# Patient Record
Sex: Female | Born: 1945 | Race: White | Hispanic: No
Health system: Southern US, Community
[De-identification: ages and names within clinical notes are randomized; demographics above are authoritative.]

---

## 2005-08-13 ENCOUNTER — Ambulatory Visit: Payer: Self-pay | Admitting: Cardiology

## 2005-08-17 ENCOUNTER — Ambulatory Visit: Payer: Self-pay | Admitting: Cardiology

## 2005-08-21 ENCOUNTER — Ambulatory Visit: Payer: Self-pay | Admitting: Cardiology

## 2005-08-23 ENCOUNTER — Inpatient Hospital Stay (HOSPITAL_BASED_OUTPATIENT_CLINIC_OR_DEPARTMENT_OTHER): Admission: RE | Admit: 2005-08-23 | Discharge: 2005-08-23 | Payer: Self-pay | Admitting: Internal Medicine

## 2005-08-23 ENCOUNTER — Ambulatory Visit: Payer: Self-pay | Admitting: Internal Medicine

## 2007-10-23 ENCOUNTER — Inpatient Hospital Stay (HOSPITAL_COMMUNITY): Admission: RE | Admit: 2007-10-23 | Discharge: 2007-10-28 | Payer: Self-pay | Admitting: Orthopaedic Surgery

## 2010-08-15 NOTE — Op Note (Signed)
NAMENEVIN, KOZUCH             ACCOUNT NO.:  [id number]   MEDICAL RECORD NO.:  [id number]          PATIENT TYPE:  INP   LOCATION:  2899                         FACILITY:  [facility]   PHYSICIAN:  Claude Manges. Whitfield, M.D.DATE OF BIRTH:  Sep 13, 1945   DATE OF PROCEDURE:  10/23/2007  DATE OF DISCHARGE:                               OPERATIVE REPORT   PREOPERATIVE DIAGNOSIS:  Osteoarthritis, right hip.   POSTOPERATIVE DIAGNOSIS:  Osteoarthritis, right hip.   PROCEDURE:  Right total hip replacement.   SURGEON:  Claude Manges. Cleophas Dunker, MD   ASSISTANT:  Lenard Galloway. Chaney Malling, MD   ANESTHESIA:  General orotracheal.   COMPLICATIONS:  None.   COMPONENTS:  DePuy AML large stature 13.5 mm femoral component with a 36  mm outer diameter hip ball with -2 mm neck length.  A 52 mm outer  diameter 100 series acetabular component with an apex hole eliminator  and a 52 mm outer diameter +4 polyethylene liner with a 10-degree  posterior lip, 36 mm inner diameter.   PROCEDURE:  With the patient comfortable on the operating table and  under general orotracheal anesthesia, nursing staff inserted a Foley  catheter.  Urine was clear.  The patient was then placed in the lateral  decubitus position with the right side up.  The patient was secured to  the operating room table.  The patient was initially evaluated in the  holding area, and the right lower extremity was marked as the  appropriate operative extremity with the patient awake and alert, and  agreeing.   The right hip was then prepped with Betadine scrub and DuraPrep from the  iliac crest to below the knee.  Sterile draping was performed.   A routine southern incision was utilized and via sharp dissection  carried down to subcutaneous tissue.  There was abundant adipose tissue  that was incised with a Bovie and separated with self-retaining  retractors.  The iliotibial band was identified and incised along the  length of the skin incision.  A  East-West retractor was inserted.  With  the hip internally rotated, the short external rotators were identified.  Tendinous structures were tagged with 0 Ethibond suture.  The capsule  was identified and incised along the femoral neck and head.  There was a  serosanguineous joint effusion and synovitis.  The hip was then  dislocated posteriorly.  There were areas of articular cartilage loss  and some peripheral osteophytes.   The head was then osteotomized using the calcar guide.  Retractors were  then placed above the femoral neck.  Center hole was then made in the  piriformis fossa.  Canal finder was inserted.  Reaming was performed to  13 mm to accept a 13.5 component.  Rasping was performed sequentially  from 10.5-13.5 mm.  I felt like the small stature was not stout enough  within the canal; and therefore, returned to 10.5 large, 12 large, and  then a 13.5 large femoral component with a very nice fit.  Calcar reamer  was used to obtain the appropriate calcar angle.   Retractor was then placed about the acetabulum.  The labrum was sharply  excised.  Reaming was performed sequentially to 51 mm to accept a 52 mm  component.  We trialed the 50 mm with excellent rim fit, but we could  completely seat 52.  We had good rim fit, but we could not completely  seat.  Accordingly, the 52 mm outer diameter 100 series metallic  acetabular component was then impacted into the acetabulum.  We inserted  the trial polyethylene liner, the 13.5 mm large stature femoral rasp.  We then trialed the 36 mm outer diameter hip ball with a -2 neck length.  The entire construct was reduced.  Through full range of motion, I did  not feel like it was too tight.  There was no impingement.  No  instability and leg lengths were symmetrical.   The hip was then dislocated.  The trial components were removed.  The  joint was copiously irrigated with saline solution.  We further impacted  the acetabular component,  inserted the apex hole eliminator followed by  the final +4 Marathon polyethylene liner to accept a 36 mm outer  diameter hip ball.   The 13.5 large stature femoral component was then impacted and flushed  on the calcar.  We dried the Morse taper neck, trialed the -2 hip ball  with 36 mm diameter, reduced this, and again through full range of  motion, we had perfect stability and leg lengths were symmetrical.   The hip was again reduced.  We again reirrigated the joint with saline  solution.  The trial ball was removed.  The final 36 mm outer diameter  hip ball with a -2 neck length was then placed and impacted on the  Morris taper neck.  The hip was again reduced and again through full  range of motion, we had very nice stability and no toggling.   Joint was again irrigated with saline solution.  The capsule was closed  anatomically with 0 Ethibond suture.  The short external rotators were  closed with similar material.  Iliotibial band was closed with a running  0 Vicryl, the subcu with several layers of 0 and 2-0 Vicryl, and skin  closed with skin clips.  Sterile Mepilex dressing was applied.  The  patient was then transferred to the operating room stretcher in the  supine position and a knee immobilizer placed to the right lower  extremity.   The patient was then awakened and returned to the Postanesthesia  Recovery Room in satisfactory condition.      Claude Manges. Cleophas Dunker, M.D.  Electronically Signed     PWW/MEDQ  D:  10/23/2007  T:  10/24/2007  Job:  [id number]

## 2010-08-18 NOTE — Cardiovascular Report (Signed)
Lorraine Lopez, Lorraine Lopez             ACCOUNT NO.:  [id number]   MEDICAL RECORD NO.:  [id number]          PATIENT TYPE:  OIB   LOCATION:  1962                         FACILITY:  [facility]   PHYSICIAN:  Arvilla Meres, M.D. LHCDATE OF BIRTH:  [birth year]   DATE OF PROCEDURE:  08/23/2005  DATE OF DISCHARGE:                              CARDIAC CATHETERIZATION   PRIMARY CARE PHYSICIAN:  Doreen Beam, M.D., in [place].   CARDIOLOGIST:  Willa Rough, M.D., in [place].   PATIENT IDENTIFICATION:  Lorraine Lopez is a 65 year old woman with history of  severe gastroesophageal reflux disease status post Nissen fundoplication,  hypertension and hyperlipidemia but no known coronary disease.  Over the  past few weeks, she has been having atypical chest pain and dyspnea on  exertion.  She underwent a Cardiolite that showed normal ejection fraction,  question ischemia in the anterior wall and possibly the basilar inferior  lateral wall.  However, there was some artifact.  She was thus referred for  cardiac catheterization in the outpatient catheterization lab.   PROCEDURES PERFORMED:  1.  Selective coronary angiography.  2.  Left heart cath.  3.  Left ventriculogram.   DESCRIPTION OF PROCEDURE:  The risks and benefits of catheterization were  explained.  Consent was signed and placed on the chart.  A 4-French arterial  sheath was placed in the right femoral artery.  Standard preformed catheters  including a JL-4, 3-D RC and angled pigtail were used for procedure.  All  catheter changes were made over wire.  There are no apparent complications.   Central aortic pressure 143/81 with a mean of 108.  LV pressure is 135/6  with LVEDP of 16.  There is no aortic stenosis.   Left main was normal.   LAD was a long vessel coursing to the apex.  It gave off a moderate-sized  first diagonal, a tiny second diagonal, a small to moderate third diagonal  and tiny fourth diagonal.  There was significant calcification  through the  proximal and mid LAD.  There is a 30% lesion proximally and a 50% focal  lesion in the proximal to mid section.   Left circumflex was a moderate-sized vessel.  It gave off a moderate size  ramus branch and large OM1.  There is a 30% ostial lesion in the circumflex  at a bend.  No other significant atherosclerosis.   Right coronary artery:  A large dominant vessel that gave off an acute  marginal branch, a moderate size PDA and two small to moderate PLs.  There  was minor luminal irregularities.   Left ventriculogram done in the RAO position showed an EF of 65% with no  wall motion abnormalities.   ASSESSMENT:  1.  Nonobstructive coronary disease as described above.  2.  Normal LV function.   PLAN/DISCUSSION:  I suspect her chest pain is noncardiac.  However, she does  have significant amount of calcium, especially in her LAD, although this is  not obstructive.  I recommend aggressive risk factor reduction.  I would  also consider possible pulmonary rehabilitation for her dyspnea.  Arvilla Meres, M.D. [facility]  Electronically Signed     DB/MEDQ  D:  08/23/2005  T:  08/23/2005  Job:  [id number]

## 2010-08-18 NOTE — Discharge Summary (Signed)
NAMEALLAYNA, ERLICH             ACCOUNT NO.:  [id number]   MEDICAL RECORD NO.:  [id number]          PATIENT TYPE:  INP   LOCATION:  3027                         FACILITY:  [facility]   PHYSICIAN:  Claude Manges. Whitfield, M.D.DATE OF BIRTH:  08-May-1945   DATE OF ADMISSION:  10/23/2007  DATE OF DISCHARGE:  10/28/2007                               DISCHARGE SUMMARY   ADMISSION DIAGNOSIS:  Osteoarthritis of the right hip.   DISCHARGE DIAGNOSES:  1. Osteoarthritis of the right hip.  2. Post hemorrhagic anemia.  3. Obesity.  4. Hypertension.  5. Diaphragmatic hernia.  6. Urinary frequency.   PROCEDURE:  Right total hip arthroplasty.   HISTORY:  Nima Bamburg is a 65 year old female with greater than 2-  year history of right hip pain with radiation to the groin and thigh.  Had an insidious onset without any history of injury or trauma.  Now  having constant severe, sharp, throbbing, and aching pain.  Night pain  as well as activities of daily living pain and with ambulation.  She  uses a cane at times as well as hydrocodone for pain.  She has failed  her conservative treatment.  Radiographic osteoarthritis of the right  hip is noted.  Indicated now for a right total hip arthroplasty.   HOSPITAL COURSE:  A 65 year old female admitted on October 23, 2007, after  appropriate laboratory studies were obtained as well as Ancef 1 g IV On-  call to the operating and was taken to the operating room where she  underwent a right total hip arthroplasty.  She was continued on Ancef 1  g IV q.6 h. with 3 doses.  Dilaudid full dose and PCA pump was used for  pain management.  PT, OT, and care management consults were made.  A  Foley was placed intraoperatively.  Ambulation and weightbearing as  tolerated.  She was allowed out of bed to chair the following day.  She  was started on Coumadin protocol.  Lovenox 30 mg subcu q.12 h. beginning  on October 24, 2007, at 8 a.m.  She had a normal sterile saline bolus  on  October 25, 2007, of 1000 mL bag.  On October 26, 2007, her Benicar was held  as well as hydrochlorothiazide.  Urine culture and UA was ordered.  On  October 24, 2007, she was transfused 2 units of packed cells.  Her PCA was  discontinued and a Foley was also discontinued on October 25, 2007.  She  was started on Keflex 500 mg p.o. t.i.d. on October 27, 2007, because of  some drainage of wound.  On October 28, 2007, her Lovenox was discontinued.  She was allowed to be discharged to home on October 28, 2007, in improved  condition.  To follow back up with [place] in 2 weeks for recheck evaluation.  EKG on Aug 28, 2007, revealed left forehead axis with otherwise normal  EKG.  Preop hemoglobin 10.8, hematocrit 34.3, white count 6800, and  platelets 354,000.  Discharge hemoglobin 10.5, hematocrit 32.4%, white  count 6100, and platelets 353,000.  On her smears revealed Howell-Jowell  bodies and  polychromasia.  Protime, preop 12.6; INR 0.9; and PTT 24.  Discharge protime 30.3 and INR 2.7.  Preop sodium 139, potassium 3.9,  chloride 108, CO2 of 22, glucose 96, BUN 16, and creatinine 0.75.  Discharge sodium 138, potassium 3.8, chloride 103, CO2 of 29, glucose  108, BUN 8, and creatinine 0.64.  GFR, preop was greater than 60 with  calcium of 9.6.  Discharge GFR greater than 60 with calcium 8.7.  Preop  total protein 7.2, albumin 3.8, AST 24, ALT 16, ALP 67, and total  bilirubin 0.8.  Preop urinalysis showed many epithelials 0-2 whites.  On  October 26, 2007, revealed small amount of leukocyte esterase with many  epithelials 0-2 whites and 0-2 reds.  She did have hyaline casts, uric  acid crystals, and mucus on the first urinalysis on October 20, 2007.  Blood type was O negative and antibody screen negative.  Given 2 units  packed cells.  Urine culture on October 20, 2007, showed no growth.  Urine  culture on October 26, 2007, revealed greater than 1000 colonies and  multiple bacterial morphotypes.   DISCHARGE INSTRUCTIONS:   Continue her home diet.  Keep her incision  clean and dry.  Change dressing daily.  Weightbearing as tolerated, is  taught PT.  She can use her walker.  Increase her activity slowly.  She  may shower on Wednesday.  No lifting or driving for 6 weeks.   PRESCRIPTION:  1. Percocet 5/325 one to two tablets every 4 hours as needed for pain.  2. Robaxin 500 mg one tablet every 6 hours as needed for spasms.  3. Coumadin 5 mg as directed by home health pharmacist.  4. Keflex 5 mg one p.o. t.i.d.  5. Colace 100 mg p.o. b.i.d. stool softener.   She will need to follow back up with Dr. Cleophas Dunker on November 05, 2007.  Discharged in improved condition.      Oris Drone Petrarca, P.A.-C.      Claude Manges. Cleophas Dunker, M.D.  Electronically Signed    BDP/MEDQ  D:  11/25/2007  T:  11/26/2007  Job:  [id number]

## 2010-12-29 LAB — URINALYSIS, MICROSCOPIC ONLY
Hgb urine dipstick: NEGATIVE
Nitrite: NEGATIVE
Protein, ur: 30 — AB
Specific Gravity, Urine: 1.015
Urobilinogen, UA: 1

## 2010-12-29 LAB — CROSSMATCH
ABO/RH(D): O NEG
Antibody Screen: NEGATIVE
Antibody Screen: NEGATIVE

## 2010-12-29 LAB — URINE MICROSCOPIC-ADD ON

## 2010-12-29 LAB — BASIC METABOLIC PANEL
BUN: 4 — ABNORMAL LOW
CO2: 23
Calcium: 8.4
Calcium: 8.5
Chloride: 103
Chloride: 107
Creatinine, Ser: 0.6
Creatinine, Ser: 0.64
Creatinine, Ser: 0.79
GFR calc Af Amer: >60
GFR calc Af Amer: >60
GFR calc Af Amer: >60
GFR calc Af Amer: >60
GFR calc non Af Amer: 58 — ABNORMAL LOW
GFR calc non Af Amer: >60
GFR calc non Af Amer: >60
Potassium: 3.8
Sodium: 134 — ABNORMAL LOW
Sodium: 138
Sodium: 138

## 2010-12-29 LAB — PROTIME-INR
INR: 0.9
INR: 1
INR: 1.2
INR: 1.5
INR: 1.8 — ABNORMAL HIGH
Prothrombin Time: 13.6
Prothrombin Time: 15.9 — ABNORMAL HIGH
Prothrombin Time: 18.3 — ABNORMAL HIGH
Prothrombin Time: 30.3 — ABNORMAL HIGH

## 2010-12-29 LAB — CBC
Hemoglobin: 10.1 — ABNORMAL LOW
Hemoglobin: 8 — ABNORMAL LOW
MCHC: 33.1
MCV: 84.9
MCV: 85.7
Platelets: 253
Platelets: 354
RBC: 3.01 — ABNORMAL LOW
RBC: 3.65 — ABNORMAL LOW
RBC: 3.65 — ABNORMAL LOW
RBC: 3.78 — ABNORMAL LOW
RDW: 20.6 — ABNORMAL HIGH
RDW: 23.4 — ABNORMAL HIGH
WBC: 6.1
WBC: 7.1
WBC: 8

## 2010-12-29 LAB — DIFFERENTIAL
Basophils Relative: 0
Eosinophils Relative: 4
Lymphs Abs: 2.1
Monocytes Absolute: 0.6
Neutrophils Relative %: 56

## 2010-12-29 LAB — URINE CULTURE
Colony Count: >=100000
Colony Count: NO GROWTH
Culture: NO GROWTH

## 2010-12-29 LAB — ABO/RH: ABO/RH(D): O NEG

## 2010-12-29 LAB — COMPREHENSIVE METABOLIC PANEL
ALT: 16
Albumin: 3.8
Alkaline Phosphatase: 67
Potassium: 3.9
Sodium: 139
Total Protein: 7.2

## 2010-12-29 LAB — URINALYSIS, ROUTINE W REFLEX MICROSCOPIC
Hgb urine dipstick: NEGATIVE
Specific Gravity, Urine: 1.026
Urobilinogen, UA: 0.2

## 2016-04-11 ENCOUNTER — Other Ambulatory Visit: Payer: Self-pay | Admitting: Rheumatology

## 2016-04-11 ENCOUNTER — Ambulatory Visit
Admission: RE | Admit: 2016-04-11 | Discharge: 2016-04-11 | Disposition: A | Discharge status: Home / Self Care | Payer: Medicare HMO | Source: Ambulatory Visit | Attending: Rheumatology | Admitting: Rheumatology

## 2016-04-11 DIAGNOSIS — M545 Low back pain: Secondary | ICD-10-CM

## 2016-04-11 DIAGNOSIS — W19XXXA Unspecified fall, initial encounter: Secondary | ICD-10-CM

## 2016-04-11 DIAGNOSIS — M25552 Pain in left hip: Secondary | ICD-10-CM

## 2016-04-23 ENCOUNTER — Other Ambulatory Visit: Payer: Self-pay | Admitting: Rheumatology

## 2016-04-23 DIAGNOSIS — M25552 Pain in left hip: Secondary | ICD-10-CM

## 2016-04-23 DIAGNOSIS — M1612 Unilateral primary osteoarthritis, left hip: Secondary | ICD-10-CM

## 2016-04-26 ENCOUNTER — Ambulatory Visit
Admission: RE | Admit: 2016-04-26 | Discharge: 2016-04-26 | Disposition: A | Discharge status: Home / Self Care | Payer: Medicare HMO | Source: Ambulatory Visit | Attending: Rheumatology | Admitting: Rheumatology

## 2016-04-26 DIAGNOSIS — M1612 Unilateral primary osteoarthritis, left hip: Secondary | ICD-10-CM

## 2016-04-26 DIAGNOSIS — M25552 Pain in left hip: Secondary | ICD-10-CM

## 2016-04-26 MED ORDER — METHYLPREDNISOLONE ACETATE 40 MG/ML INJ SUSP (RADIOLOG
120.0000 mg | Freq: Once | INTRAMUSCULAR | Status: AC
  Administered 2016-04-26: 120 mg via INTRA_ARTICULAR

## 2016-04-26 MED ORDER — IOPAMIDOL (ISOVUE-M 200) INJECTION 41%
1.0000 mL | Freq: Once | INTRAMUSCULAR | Status: AC
  Administered 2016-04-26: 1 mL via INTRA_ARTICULAR

## 2016-04-26 NOTE — Discharge Instructions (Signed)

## 2017-04-28 IMAGING — CR DG HIP (WITH OR WITHOUT PELVIS) 2-3V*L*
2 series · 2 of 2 positions shown · non-contrast
Comparison: AP view of the pelvis of October 23, 2007

CLINICAL DATA: Status post fall 2 weeks ago with persistent low
back and left hip pain.

EXAM:
DG HIP (WITH OR WITHOUT PELVIS) 2-3V LEFT

[w hip ap left]
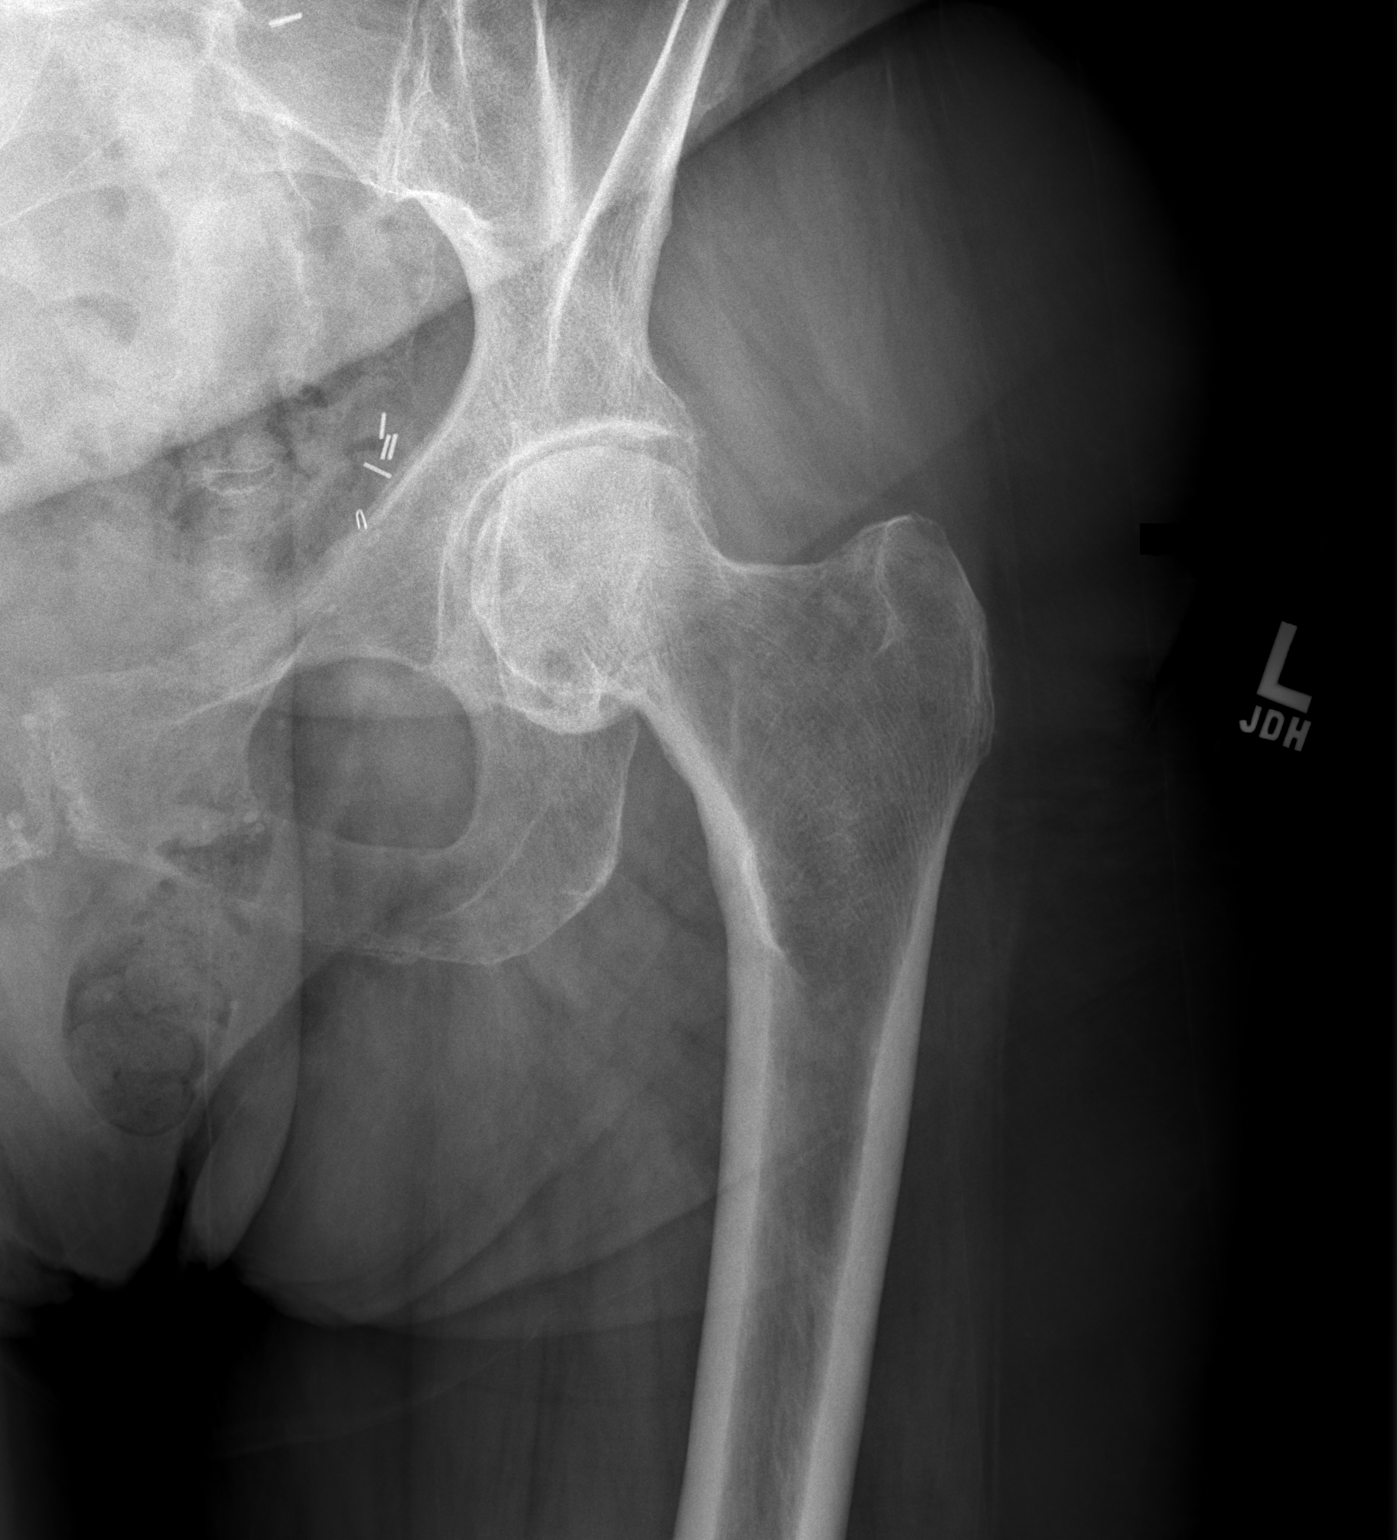

[w hip lat left]
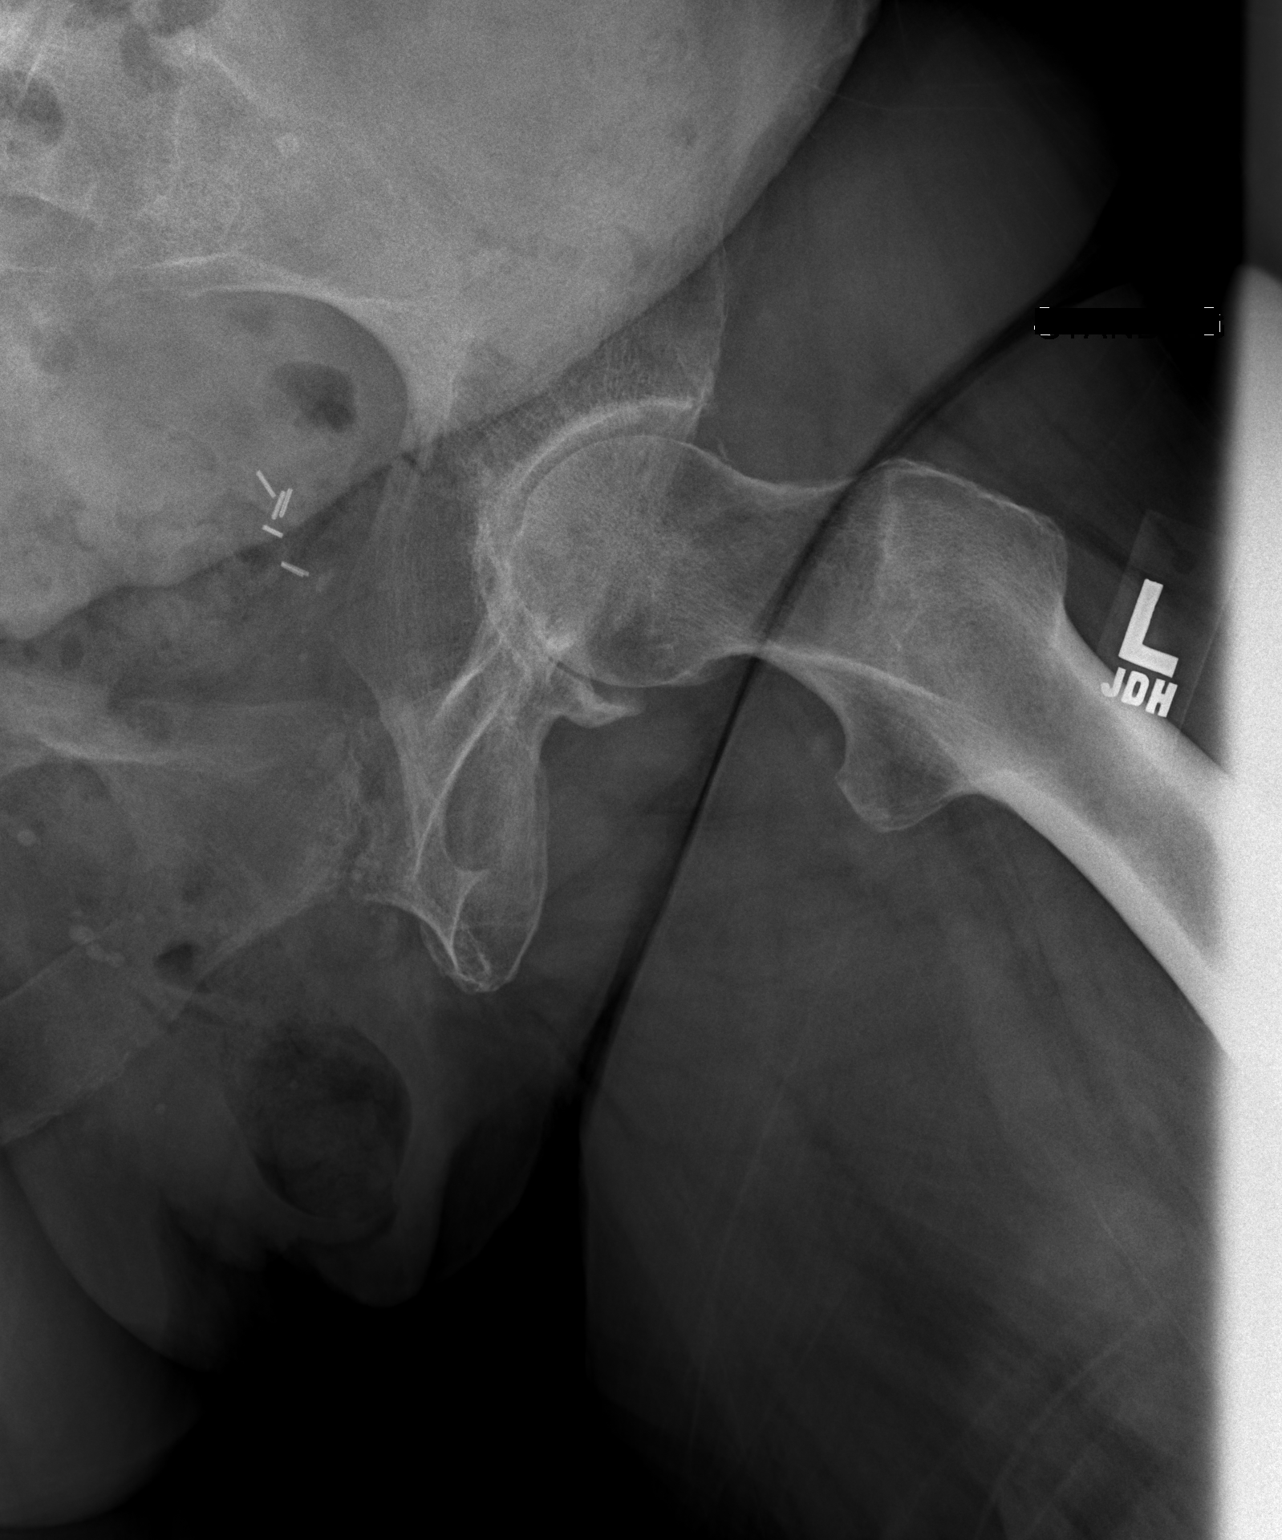

[2 of 2 positions shown; findings below may reference images not displayed]

FINDINGS: The bones are subjectively adequately mineralized. There is moderate
symmetric joint space loss of the left hip. This has only minimally
progressed over the pre seeding 9 years. The articular surfaces of
the femoral head and acetabulum remains smoothly rounded. The
femoral neck, intertrochanteric, and subtrochanteric regions are
normal. The observed portions of the left hemipelvis pelvis exhibit
no acute abnormalities.
IMPRESSION: Moderate symmetric joint space loss of the left hip consistent with
osteoarthritis. No acute bony abnormality.

## 2017-05-13 IMAGING — XA DG FLUORO GUIDE NDL PLC/BX
1 series · 1 of 1 positions shown · non-contrast
Comparison: none

CLINICAL DATA: Left hip pain.  Osteoarthritis.

[Series 1: ortho adipose · 1 of 1 slices shown]
[im 1/1]
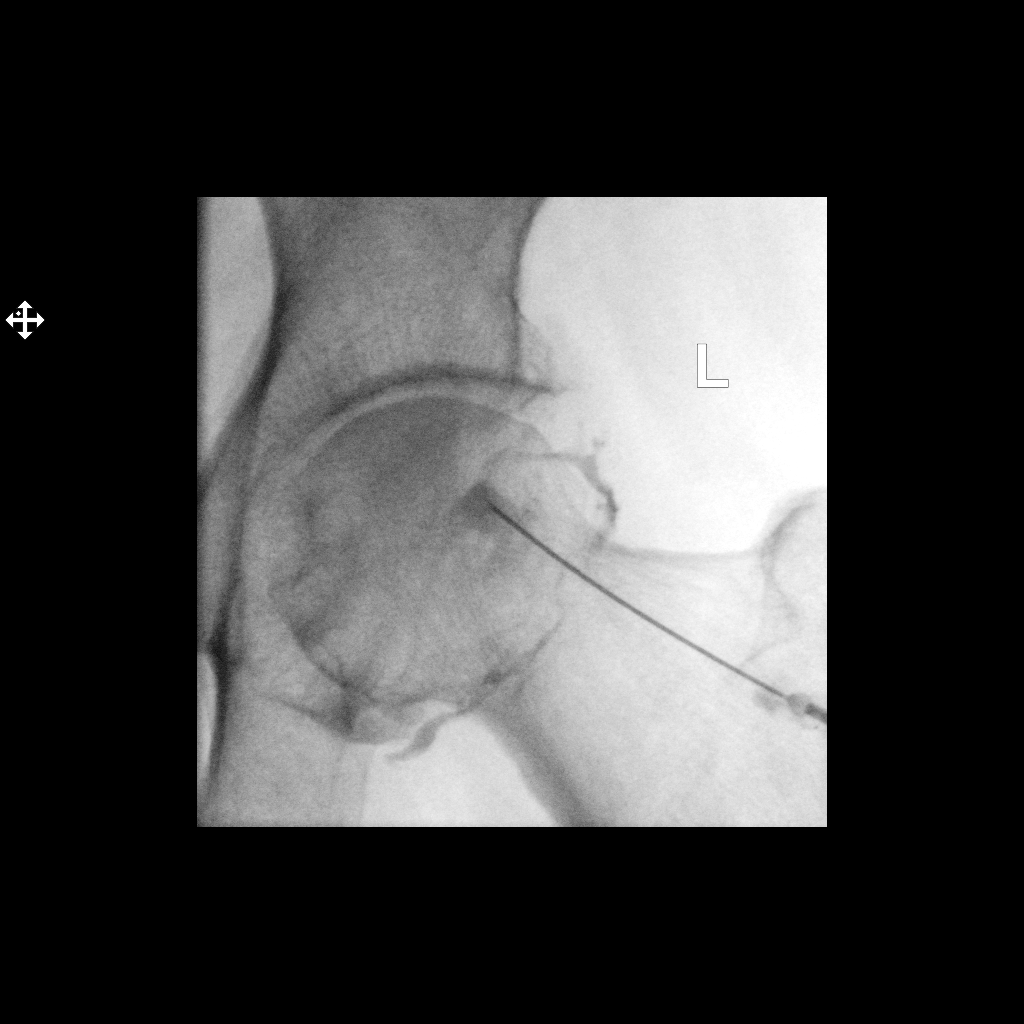

[1 of 1 positions shown; findings below may reference images not displayed]

FLUOROSCOPY TIME:  Radiation Exposure Index (as provided by the
fluoroscopic device): 7.66 uGy*m2

Fluoroscopy Time:  10 seconds

Number of Acquired Images:  0

PROCEDURE:
Following informed, written consent, the patient was positioned
supine and the hip was located under fluoroscopy. The skin was
prepped and draped in the usual sterile fashion. The superficial
soft tissues were anesthetized with 1% lidocaine. A 22 gauge needle
was advanced into the joint. Placement was confirmed with 1.0 ml of
Isovue M 200.

I then injected 120 mg Depo-Medrol and 1.5 mL 0.5% bupivacaine. The
patient tolerated the procedure without immediate complication.
IMPRESSION: Technically successful left hip steroid injection.

## 2023-08-01 DEATH — deceased
